# Patient Record
Sex: Male | Born: 1985 | Race: White | Hispanic: No | Marital: Single | Smoking: Current every day smoker
Health system: Southern US, Community
[De-identification: ages and names within clinical notes are randomized; demographics above are authoritative.]

## PROBLEM LIST (undated history)

## (undated) HISTORY — PX: ABCESS DRAINAGE: SHX399

## (undated) HISTORY — PX: EYE SURGERY: SHX253

---
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
## 2019-01-23 ENCOUNTER — Emergency Department (HOSPITAL_COMMUNITY): Payer: Self-pay

## 2019-01-23 ENCOUNTER — Encounter (HOSPITAL_COMMUNITY): Payer: Self-pay

## 2019-01-23 ENCOUNTER — Emergency Department (HOSPITAL_COMMUNITY)
Admission: EM | Admit: 2019-01-23 | Discharge: 2019-01-23 | Disposition: A | Discharge status: Home / Self Care | Payer: Self-pay | Attending: Emergency Medicine | Admitting: Emergency Medicine

## 2019-01-23 ENCOUNTER — Other Ambulatory Visit: Payer: Self-pay

## 2019-01-23 DIAGNOSIS — Y9289 Other specified places as the place of occurrence of the external cause: Secondary | ICD-10-CM | POA: Insufficient documentation

## 2019-01-23 DIAGNOSIS — F1210 Cannabis abuse, uncomplicated: Secondary | ICD-10-CM | POA: Insufficient documentation

## 2019-01-23 DIAGNOSIS — S299XXA Unspecified injury of thorax, initial encounter: Secondary | ICD-10-CM | POA: Insufficient documentation

## 2019-01-23 DIAGNOSIS — F17200 Nicotine dependence, unspecified, uncomplicated: Secondary | ICD-10-CM | POA: Insufficient documentation

## 2019-01-23 DIAGNOSIS — Y9389 Activity, other specified: Secondary | ICD-10-CM | POA: Insufficient documentation

## 2019-01-23 DIAGNOSIS — W01198A Fall on same level from slipping, tripping and stumbling with subsequent striking against other object, initial encounter: Secondary | ICD-10-CM | POA: Insufficient documentation

## 2019-01-23 DIAGNOSIS — Y998 Other external cause status: Secondary | ICD-10-CM | POA: Insufficient documentation

## 2019-01-23 MED ORDER — NAPROXEN 500 MG PO TABS: 500.0000 mg | ORAL_TABLET | Freq: Two times a day (BID) | ORAL | 0 refills | Status: AC

## 2019-01-23 MED ORDER — METHOCARBAMOL 500 MG PO TABS: 500.0000 mg | ORAL_TABLET | Freq: Three times a day (TID) | ORAL | 0 refills | Status: AC | PRN

## 2019-01-23 MED ORDER — LIDOCAINE 5 % EX PTCH: 1.0000 | MEDICATED_PATCH | CUTANEOUS | 0 refills | Status: AC

## 2019-01-23 NOTE — Discharge Instructions (Signed)
You were seen in the ER for back pain after a fall. Your xray did not show any fractures.  We are sending you home with the following medicines:   - Naproxen is a nonsteroidal anti-inflammatory medication that will help with pain and swelling. Be sure to take this medication as prescribed with food, 1 pill every 12 hours,  It should be taken with food, as it can cause stomach upset, and more seriously, stomach bleeding. Do not take other nonsteroidal anti-inflammatory medications with this such as Advil, Motrin, Aleve, Mobic, Goodie Powder, or Motrin.    - Robaxin is the muscle relaxer I have prescribed, this is meant to help with muscle tightness. Be aware that this medication may make you drowsy therefore the first time you take this it should be at a time you are in an environment where you can rest. Do not drive or operate heavy machinery when taking this medication. Do not drink alcohol or take other sedating medications with this medicine such as narcotics or benzodiazepines.   You make take Tylenol per over the counter dosing with these medications.   We have prescribed you new medication(s) today. Discuss the medications prescribed today with your pharmacist as they can have adverse effects and interactions with your other medicines including over the counter and prescribed medications. Seek medical evaluation if you start to experience new or abnormal symptoms after taking one of these medicines, seek care immediately if you start to experience difficulty breathing, feeling of your throat closing, facial swelling, or rash as these could be indications of a more serious allergic reaction

## 2019-01-23 NOTE — ED Triage Notes (Signed)
Pt states that he slipped and fell on wet leaves over the weekend. Pt states he has pain on the back right side, about halfway down his back. Pt states pain is worse with coughing, sneezing,etc.

## 2019-01-23 NOTE — ED Notes (Signed)
Patient ambulated to x-ray.

## 2019-01-23 NOTE — ED Provider Notes (Signed)
[facility]-EMERGENCY DEPT Provider Note   CSN: [id number] Arrival date & time: 01/23/19  4098     History   Chief Complaint Chief Complaint  Patient presents with  . Back Pain    HPI Wesley Schroeder is a 33 y.o. male with a history of tobacco abuse who presents to the emergency department with complaints of right mid back pain status post fall 5 days prior.  Patient states that he was drinking some alcohol when he slipped back and fell hitting his right mid back on the corner of a deck.  He denies head injury or loss of consciousness.  He states he has been having discomfort to the right mid back since injury that is constant spasms, worse with certain movements and with coughing, mildly alleviated by ibuprofen and a hot shower. Denies hemoptysis, dyspnea, hematuria, numbness, tingling, weakness, saddle anesthesia, incontinence to bowel/bladder, fever, chills, IV drug use, dysuria, or hx of cancer. Patient has not had prior back surgeries.      HPI  History reviewed. No pertinent past medical history.  There are no active problems to display for this patient.   Past Surgical History:  Procedure Laterality Date  . ABCESS DRAINAGE    . EYE SURGERY          Home Medications    Prior to Admission medications   Not on File    Family History No family history on file.  Social History Social History   Tobacco Use  . Smoking status: Current Every Day Smoker  . Smokeless tobacco: Never Used  Substance Use Topics  . Alcohol use: Yes  . Drug use: Yes    Types: Marijuana     Allergies   Patient has no known allergies.   Review of Systems Review of Systems  Constitutional: Negative for chills, fever and unexpected weight change.  Respiratory: Negative for shortness of breath.        Negative for hemoptysis.  Gastrointestinal: Negative for abdominal pain, nausea and vomiting.  Genitourinary: Negative for dysuria.  Musculoskeletal: Positive  for back pain.  Neurological: Negative for weakness and numbness.       Negative for saddle anesthesia or bowel/bladder incontinence.     Physical Exam Updated Vital Signs BP (!) 158/99   Pulse 87   Temp 98.7 F (37.1 C) (Oral)   Resp 16   Ht 5\' 9"  (1.753 m)   Wt 77.1 kg   SpO2 96%   BMI 25.10 kg/m   Physical Exam Constitutional:      General: He is not in acute distress.    Appearance: He is well-developed. He is not toxic-appearing.  HENT:     Head: Normocephalic and atraumatic.  Neck:     Musculoskeletal: Normal range of motion and neck supple. No spinous process tenderness or muscular tenderness.  Pulmonary:     Effort: Pulmonary effort is normal.     Breath sounds: Normal breath sounds. No wheezing, rhonchi or rales.  Chest:     Chest wall: Tenderness (Right mid and lower posterior ribs without overlying abrasions or ecchymosis or palpable crepitus.) present.  Abdominal:     General: There is no distension.     Palpations: Abdomen is soft.     Tenderness: There is no abdominal tenderness. There is no guarding or rebound.  Musculoskeletal:     Comments: No obvious deformity, appreciable swelling, erythema, ecchymosis, significant open wounds, or increased warmth.  Extremities: Normal ROM. Nontender.  Back: No point/focal  vertebral tenderness, no palpable step off or crepitus.   Skin:    General: Skin is warm and dry.     Findings: No rash.  Neurological:     General: No focal deficit present.     Mental Status: He is alert.     Deep Tendon Reflexes:     Reflex Scores:      Patellar reflexes are 2+ on the right side and 2+ on the left side.    Comments: Sensation grossly intact to bilateral upper and lower extremities. 5/5 symmetric strength grip strength and strength with plantar/dorsiflexion bilaterally. Gait is intact without obvious foot drop.       ED Treatments / Results  Labs (all labs ordered are listed, but only abnormal results are displayed) Labs  Reviewed - No data to display  EKG None  Radiology No results found.  Procedures Procedures (including critical care time)  Medications Ordered in ED Medications - No data to display   Initial Impression / Assessment and Plan / ED Course  I have reviewed the triage vital signs and the nursing notes.  Pertinent labs & imaging results that were available during my care of the patient were reviewed by me and considered in my medical decision making (see chart for details).   Patient presents to the ED s/p mechanical fall 5 days prior with complaints of back pain.  Patient is nontoxic appearing, vitals WNL with the exception of elevated BP- doubt HTN emergency.  No signs of serious head/neck/back injury, no focal neuro deficits or midline spinal tenderness.  Right posterior mid to lower rib tenderness to palpation- xray negative for fx/dislocation or underlying lung abnormalities. There is no overlying ecchymosis or abrasions, no tachycardia/hypotension, no reports of hemoptysis/hematuria, abdomen soft & nontender doubt significant acute intra-thoracic/abdominal injury 5 days from injury. Will give incentive spirometry. Provide NSAID, muscle relaxant, & lidoderm patch. PCP follow up. I discussed results, treatment plan, need for follow-up, and return precautions with the patient. Provided opportunity for questions, patient confirmed understanding and is in agreement with plan.    Final Clinical Impressions(s) / ED Diagnoses   Final diagnoses:  Rib injury    ED Discharge Orders         Ordered    naproxen (NAPROSYN) 500 MG tablet  2 times daily     01/23/19 1006    methocarbamol (ROBAXIN) 500 MG tablet  Every 8 hours PRN     01/23/19 1006    lidocaine (LIDODERM) 5 %  Every 24 hours     01/23/19 1006           Charmane Protzman, [place], PA-C 01/23/19 1009    Margette Fast, MD 01/23/19 1949

## 2019-01-23 NOTE — ED Notes (Signed)
Discharge instructions reviewed with patient. Incentive spirometer given to patient and instructions reviewed. No further questions at this time.

## 2021-05-09 IMAGING — CR DG RIBS W/ CHEST 3+V*R*
4 series · 4 of 4 positions shown · non-contrast
Comparison: None.

CLINICAL DATA: Fall last weekend with persistent right posterior
rib pain.

EXAM:
RIGHT RIBS AND CHEST - 3+ VIEW

[w chest pa]
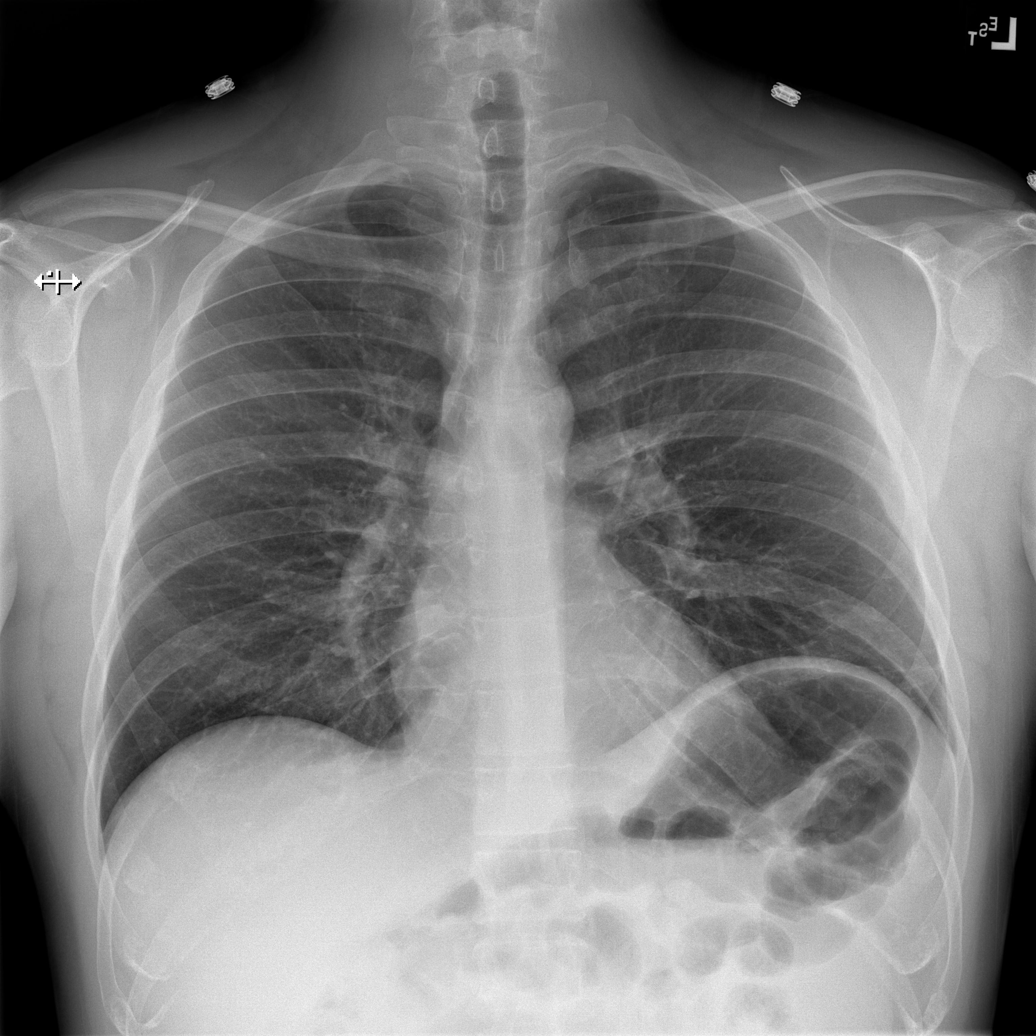

[w ribs ap upper right]
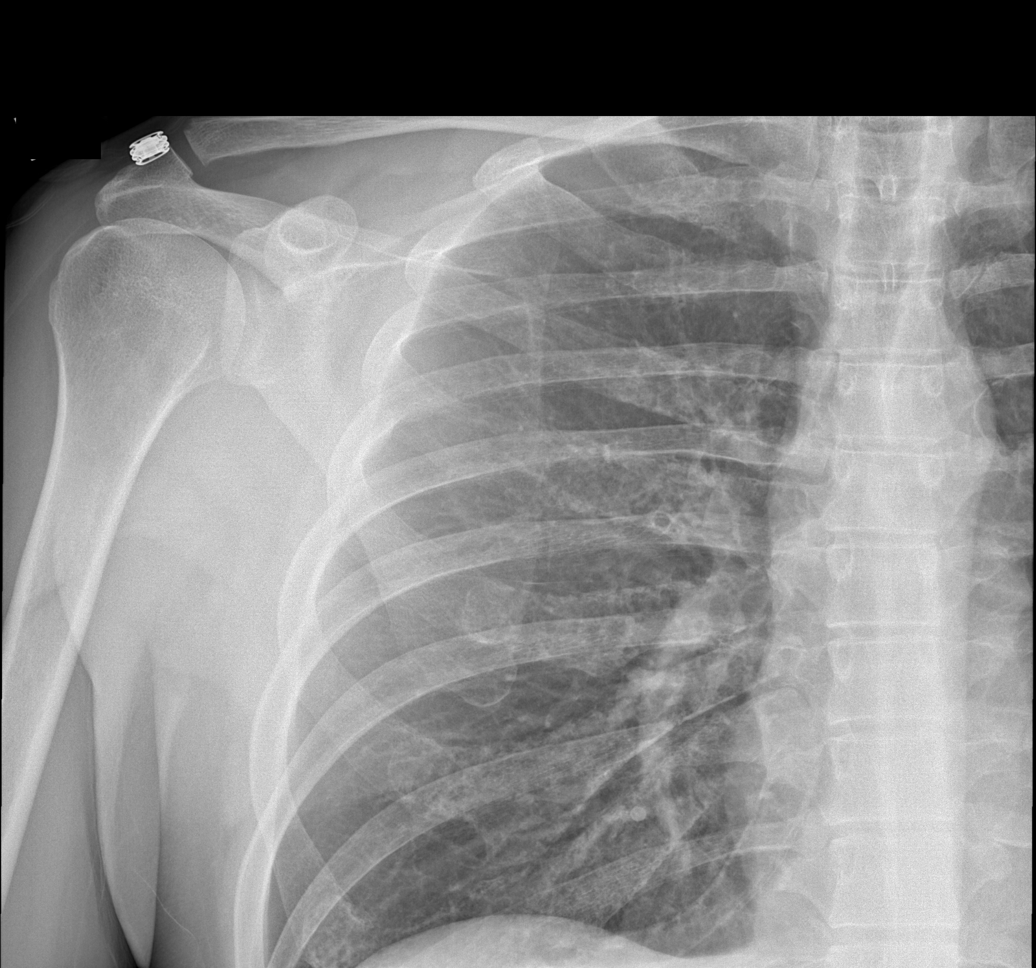

[w ribs ap lower right]
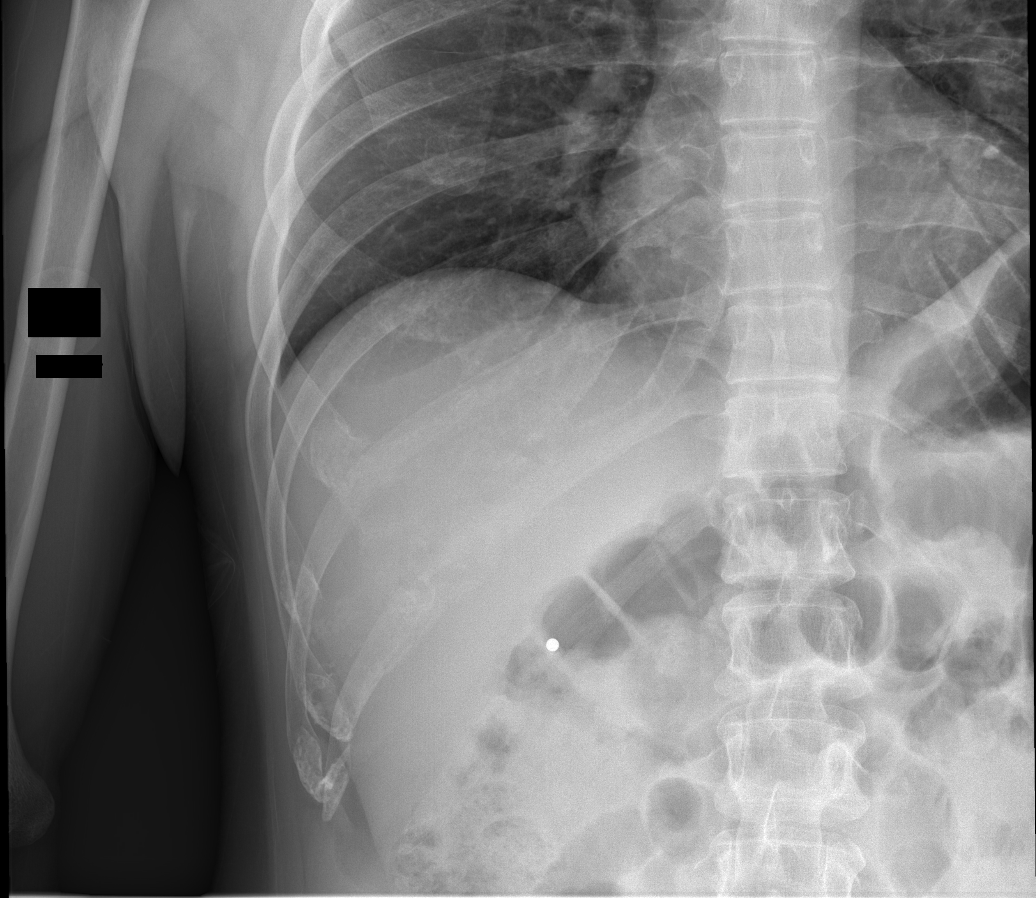

[w ribs obl right]
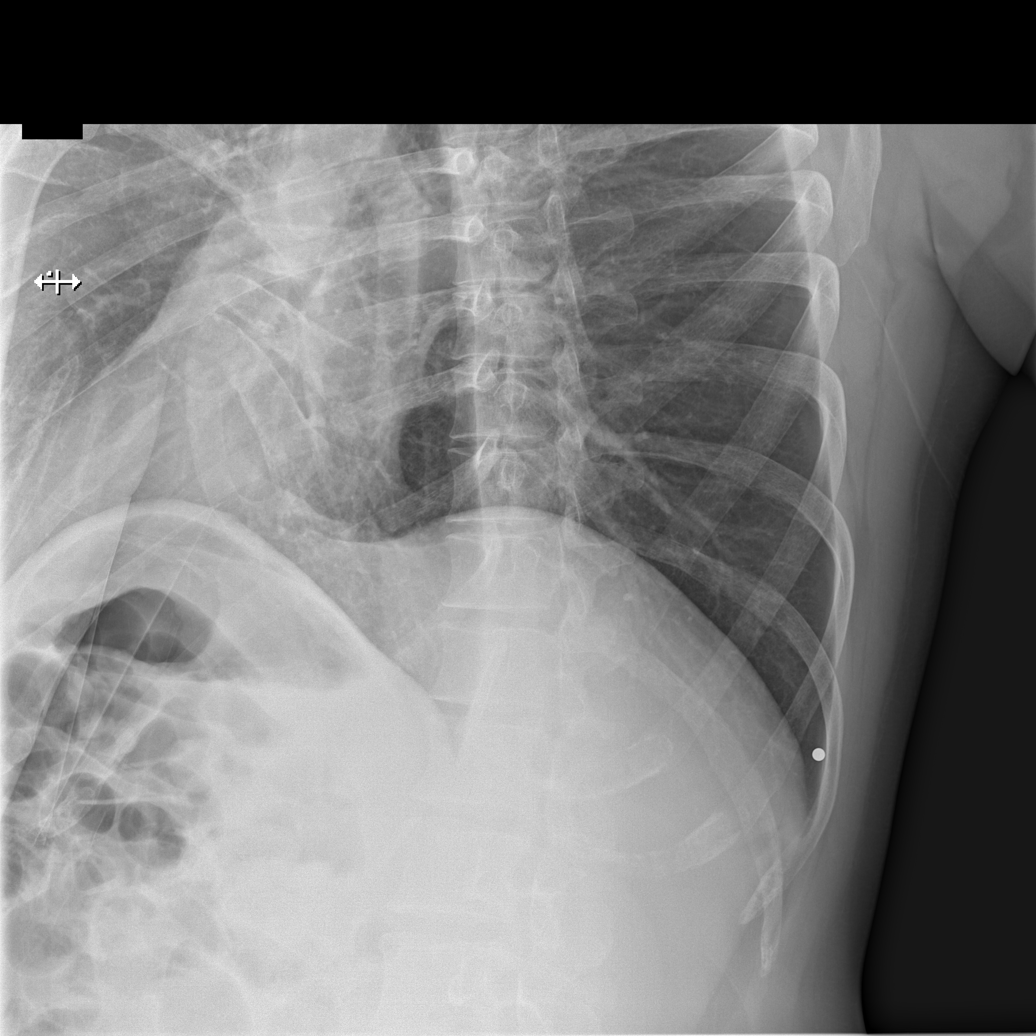

[4 of 4 positions shown; findings below may reference images not displayed]

FINDINGS: Lungs are adequately inflated with minimal elevation of the left
hemidiaphragm. No lobar consolidation, effusion or pneumothorax.
Cardiomediastinal silhouette is normal. No acute right rib fracture.
Old left posterolateral seventh rib fracture.
IMPRESSION: No acute findings.
